# Patient Record
Sex: Male | Born: 1995 | Race: Black or African American | Hispanic: No | Marital: Single | Smoking: Current every day smoker
Health system: Southern US, Community
[De-identification: ages and names within clinical notes are randomized; demographics above are authoritative.]

---
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
## 2018-05-13 ENCOUNTER — Emergency Department
Admission: EM | Admit: 2018-05-13 | Discharge: 2018-05-13 | Disposition: A | Discharge status: Home / Self Care | Payer: No Typology Code available for payment source | Attending: Emergency Medicine | Admitting: Emergency Medicine

## 2018-05-13 ENCOUNTER — Other Ambulatory Visit: Payer: Self-pay

## 2018-05-13 DIAGNOSIS — W269XXA Contact with unspecified sharp object(s), initial encounter: Secondary | ICD-10-CM | POA: Diagnosis not present

## 2018-05-13 DIAGNOSIS — Y990 Civilian activity done for income or pay: Secondary | ICD-10-CM | POA: Diagnosis not present

## 2018-05-13 DIAGNOSIS — Y929 Unspecified place or not applicable: Secondary | ICD-10-CM | POA: Diagnosis not present

## 2018-05-13 DIAGNOSIS — F17210 Nicotine dependence, cigarettes, uncomplicated: Secondary | ICD-10-CM | POA: Insufficient documentation

## 2018-05-13 DIAGNOSIS — Y93E5 Activity, floor mopping and cleaning: Secondary | ICD-10-CM | POA: Diagnosis not present

## 2018-05-13 DIAGNOSIS — Z23 Encounter for immunization: Secondary | ICD-10-CM | POA: Diagnosis not present

## 2018-05-13 DIAGNOSIS — S61412A Laceration without foreign body of left hand, initial encounter: Secondary | ICD-10-CM | POA: Diagnosis not present

## 2018-05-13 MED ORDER — CEPHALEXIN 500 MG PO CAPS
500.0000 mg | ORAL_CAPSULE | Freq: Once | ORAL | Status: AC
  Administered 2018-05-13: 500 mg via ORAL
  Filled 2018-05-13: qty 1

## 2018-05-13 MED ORDER — CEPHALEXIN 500 MG PO CAPS: 500.0000 mg | ORAL_CAPSULE | Freq: Four times a day (QID) | ORAL | 0 refills | Status: DC

## 2018-05-13 MED ORDER — TETANUS-DIPHTH-ACELL PERTUSSIS 5-2.5-18.5 LF-MCG/0.5 IM SUSP
0.5000 mL | Freq: Once | INTRAMUSCULAR | Status: AC
  Administered 2018-05-13: 0.5 mL via INTRAMUSCULAR
  Filled 2018-05-13: qty 0.5

## 2018-05-13 MED ORDER — CEPHALEXIN 500 MG PO CAPS: 500.0000 mg | ORAL_CAPSULE | Freq: Four times a day (QID) | ORAL | 0 refills | Status: AC
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
## 2018-05-13 NOTE — ED Provider Notes (Signed)
[facility] Emergency Department Provider Note  ____________________________________________   First MD Initiated Contact with Patient 05/13/18 0215     (approximate)  I have reviewed the triage vital signs and the nursing notes.   HISTORY  Chief Complaint Laceration    HPI Grant Tucker is a 22 y.o. male here for evaluation of injury to his left hand.  Patient reports while at work, he was reaching into a dishwasher machine to clean out the filter when he probably cut his hand against a piece of glass.  Reports he has a cut across the palm of the left hand.  No other injury.  No numbness weakness or tingling.  Takes no medications no allergies.  Reports his last tetanus was several years ago.   No numbness weakness or tingling in the left hand.  History reviewed. No pertinent past medical history.  There are no active problems to display for this patient.   History reviewed. No pertinent surgical history.  No medication.  Allergies Patient has no known allergies.  No family history on file.  Social History Social History   Tobacco Use  . Smoking status: Current Every Day Smoker  . Smokeless tobacco: Never Used  Substance Use Topics  . Alcohol use: Never    Frequency: Never  . Drug use: Not on file    Review of Systems Constitutional: No fever/chills or other injury Eyes: No visual changes. ENT: No sore throat. Cardiovascular: Denies chest pain. Respiratory: Denies shortness of breath. Gastrointestinal: No abdominal pain.   Genitourinary: Negative for dysuria. Musculoskeletal: Negative for back pain. Skin: Negative for rash. Neurological: Negative for headaches, areas of focal weakness or numbness.    ____________________________________________   PHYSICAL EXAM:  VITAL SIGNS: ED Triage Vitals  Enc Vitals Group     BP 05/13/18 0058 129/78     Pulse Rate 05/13/18 0058 92     Resp 05/13/18 0058 16     Temp --    Temp Source 05/13/18 0058 Oral     SpO2 05/13/18 0058 96 %     Weight 05/13/18 0052 233 lb (105.7 kg)     Height 05/13/18 0052 5\' 11"  (1.803 m)     Head Circumference --      Peak Flow --      Pain Score 05/13/18 0052 2     Pain Loc --      Pain Edu? --      Excl. in [place]? --     Constitutional: Alert and oriented. Well appearing and in no acute distress. Eyes: Conjunctivae are normal. Head: Atraumatic. Mouth/Throat: Mucous membranes are moist. Neck: No stridor.  Cardiovascular: Normal rate, regular rhythm. Grossly normal heart sounds.  Good peripheral circulation.  Strong left radial pulse with strong capillary refill. Respiratory: Normal respiratory effort.  No retractions. Musculoskeletal:   RIGHT Right upper extremity demonstrates normal strength, good use of all muscles. No edema bruising or contusions of the right shoulder/upper arm, right elbow, right forearm / hand. Full range of motion of the right right upper extremity without pain. No evidence of trauma. Strong radial pulse. Intact median/ulnar/radial neuro-muscular exam.  LEFT Left upper extremity demonstrates normal strength, good use of all muscles. No edema bruising or contusions of the left shoulder/upper arm, left elbow, left forearm / hand. Full range of motion of the left  upper extremity without pain except for some discomfort across his palm. No evidence of trauma except for a superficial laceration spanning most the width  of his left palmar crease, is very superficial in nature with some very slight subcutaneous involvement only involving about the medial 1 cm bleeding is controlled.  There is no limitation in range of motion, no evidence of joint tendon or ligamentous injury.  Strong radial pulse. Intact median/ulnar/radial neuro-muscular exam.   Neurologic:  Normal speech and language. No gross focal neurologic deficits are appreciated.  Skin:  Skin is warm, dry and intact. No rash noted. Psychiatric: Mood and  affect are normal. Speech and behavior are normal.  ____________________________________________   LABS (all labs ordered are listed, but only abnormal results are displayed)  Labs Reviewed - No data to display ____________________________________________  EKG   ____________________________________________  RADIOLOGY   ____________________________________________   PROCEDURES  Procedure(s) performed: laceration     .Marland KitchenLaceration Repair Date/Time: 05/13/2018 3:43 AM Performed by: Sharyn Creamer, MD Authorized by: Sharyn Creamer, MD   Consent:    Consent obtained:  Verbal   Consent given by:  Patient Anesthesia (see MAR for exact dosages):    Anesthesia method:  None Laceration details:    Location:  Hand   Hand location:  L palm   Length (cm):  8   Depth (mm):  2 Repair type:    Repair type:  Simple Exploration:    Wound exploration: wound explored through full range of motion     Wound extent: no areolar tissue violation noted, no fascia violation noted, no foreign bodies/material noted, no muscle damage noted, no nerve damage noted, no tendon damage noted and no vascular damage noted     Contaminated: no   Treatment:    Area cleansed with:  Betadine   Amount of cleaning:  Standard   Irrigation solution:  Sterile saline and tap water   Visualized foreign bodies/material removed: no   Skin repair:    Repair method:  Tissue adhesive Approximation:    Approximation:  Close Post-procedure details:    Dressing:  Open (no dressing)   Patient tolerance of procedure:  Tolerated well, no immediate complications    Critical Care performed: No  ____________________________________________   INITIAL IMPRESSION / ASSESSMENT AND PLAN / ED COURSE  Pertinent labs & imaging results that were available during my care of the patient were reviewed by me and considered in my medical decision making (see chart for details).   Laceration to left palmar surface.  It is  superficial in nature with bleeding well controlled no evidence of deep injury to noted.  No evidence of neurovascular compromise.  No other injuries.  Successfully repaired with Dermabond.  Will prescribe cephalexin, tetanus updated.  Carefully counseled on treatment, wound care, and careful return precautions.  Return precautions and treatment recommendations and follow-up discussed with the patient who is agreeable with the plan.       ____________________________________________   FINAL CLINICAL IMPRESSION(S) / ED DIAGNOSES  Final diagnoses:  Laceration of left hand without foreign body, initial encounter        Note:  This document was prepared using Dragon voice recognition software and may include unintentional dictation errors       Sharyn Creamer, MD 05/13/18 [phone]

## 2018-05-13 NOTE — ED Triage Notes (Signed)
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 Bleeding controlled at this time. Brings own forms.

## 2019-01-09 ENCOUNTER — Emergency Department
Admission: EM | Admit: 2019-01-09 | Discharge: 2019-01-09 | Disposition: A | Discharge status: Home / Self Care | Payer: BC Managed Care – PPO | Attending: Student in an Organized Health Care Education/Training Program | Admitting: Student in an Organized Health Care Education/Training Program

## 2019-01-09 ENCOUNTER — Encounter: Payer: Self-pay | Admitting: Emergency Medicine

## 2019-01-09 ENCOUNTER — Emergency Department: Payer: BC Managed Care – PPO

## 2019-01-09 ENCOUNTER — Other Ambulatory Visit: Payer: Self-pay

## 2019-01-09 DIAGNOSIS — M7918 Myalgia, other site: Secondary | ICD-10-CM | POA: Diagnosis not present

## 2019-01-09 DIAGNOSIS — S0990XA Unspecified injury of head, initial encounter: Secondary | ICD-10-CM | POA: Diagnosis not present

## 2019-01-09 DIAGNOSIS — Y9389 Activity, other specified: Secondary | ICD-10-CM | POA: Insufficient documentation

## 2019-01-09 DIAGNOSIS — Y92414 Local residential or business street as the place of occurrence of the external cause: Secondary | ICD-10-CM | POA: Diagnosis not present

## 2019-01-09 DIAGNOSIS — S12300A Unspecified displaced fracture of fourth cervical vertebra, initial encounter for closed fracture: Secondary | ICD-10-CM | POA: Insufficient documentation

## 2019-01-09 DIAGNOSIS — F17200 Nicotine dependence, unspecified, uncomplicated: Secondary | ICD-10-CM | POA: Diagnosis not present

## 2019-01-09 DIAGNOSIS — S199XXA Unspecified injury of neck, initial encounter: Secondary | ICD-10-CM | POA: Diagnosis present

## 2019-01-09 DIAGNOSIS — Y999 Unspecified external cause status: Secondary | ICD-10-CM | POA: Diagnosis not present

## 2019-01-09 LAB — URINALYSIS, COMPLETE (UACMP) WITH MICROSCOPIC
Bacteria, UA: NONE SEEN
Bilirubin Urine: NEGATIVE
Glucose, UA: NEGATIVE mg/dL
Hgb urine dipstick: NEGATIVE
Ketones, ur: NEGATIVE mg/dL
Leukocytes,Ua: NEGATIVE
Nitrite: NEGATIVE
Protein, ur: NEGATIVE mg/dL
Specific Gravity, Urine: 1.03 (ref 1.005–1.030)
Squamous Epithelial / HPF: NONE SEEN (ref 0–5)
pH: 6 (ref 5.0–8.0)

## 2019-01-09 MED ORDER — MELOXICAM 15 MG PO TABS: 15.0000 mg | ORAL_TABLET | Freq: Every day | ORAL | 0 refills | Status: DC

## 2019-01-09 MED ORDER — BACLOFEN 10 MG PO TABS: 10.0000 mg | ORAL_TABLET | Freq: Every day | ORAL | 1 refills | Status: AC

## 2019-01-09 MED ORDER — HYDROCODONE-ACETAMINOPHEN 5-325 MG PO TABS: 1.0000 | ORAL_TABLET | Freq: Four times a day (QID) | ORAL | 0 refills | Status: DC | PRN
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
## 2019-01-09 NOTE — ED Notes (Signed)
Phila collar placed   Awaiting MD eval
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
## 2019-01-09 NOTE — Discharge Instructions (Addendum)
Follow-up with Dr. Lacinda Axon within the week.  Call for an appointment.  Take the medication if needed for muscle spasms and pain.  The baclofen is the muscle relaxer.  Meloxicam is an anti-inflammatory.  Vicodin is a pain medication that will make you drowsy and could cause constipation.  This is also an addictive type medication so uses sparingly.  Return if any abdominal pain, chest pain, or shortness of breath.  Return if increasing pain in your neck or back.  It is normal to feel more sore tomorrow and the next day.

## 2021-05-11 ENCOUNTER — Other Ambulatory Visit: Payer: Self-pay

## 2021-05-11 ENCOUNTER — Emergency Department
Admission: EM | Admit: 2021-05-11 | Discharge: 2021-05-11 | Disposition: A | Discharge status: Home / Self Care | Payer: BC Managed Care – PPO | Attending: Emergency Medicine | Admitting: Emergency Medicine

## 2021-05-11 ENCOUNTER — Emergency Department: Payer: BC Managed Care – PPO

## 2021-05-11 DIAGNOSIS — S0093XA Contusion of unspecified part of head, initial encounter: Secondary | ICD-10-CM | POA: Insufficient documentation

## 2021-05-11 DIAGNOSIS — Z23 Encounter for immunization: Secondary | ICD-10-CM | POA: Diagnosis not present

## 2021-05-11 DIAGNOSIS — S0990XA Unspecified injury of head, initial encounter: Secondary | ICD-10-CM | POA: Diagnosis present

## 2021-05-11 DIAGNOSIS — F17210 Nicotine dependence, cigarettes, uncomplicated: Secondary | ICD-10-CM | POA: Diagnosis not present

## 2021-05-11 DIAGNOSIS — M542 Cervicalgia: Secondary | ICD-10-CM

## 2021-05-11 DIAGNOSIS — S50312A Abrasion of left elbow, initial encounter: Secondary | ICD-10-CM | POA: Diagnosis not present

## 2021-05-11 MED ORDER — TETANUS-DIPHTH-ACELL PERTUSSIS 5-2.5-18.5 LF-MCG/0.5 IM SUSY
0.5000 mL | PREFILLED_SYRINGE | Freq: Once | INTRAMUSCULAR | Status: AC
  Administered 2021-05-11: 0.5 mL via INTRAMUSCULAR
  Filled 2021-05-11: qty 0.5

## 2021-05-11 MED ORDER — ACETAMINOPHEN 500 MG PO TABS
1000.0000 mg | ORAL_TABLET | Freq: Once | ORAL | Status: AC
  Administered 2021-05-11: 1000 mg via ORAL
  Filled 2021-05-11: qty 2
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
## 2022-08-09 IMAGING — CT CT HEAD W/O CM
4 series · 17 of 47 positions shown, 19 images · non-contrast
Comparison: CT head 01/09/2019

CLINICAL DATA: Facial trauma

EXAM:
CT HEAD WITHOUT CONTRAST
CT MAXILLOFACIAL WITHOUT CONTRAST
TECHNIQUE: Multidetector CT imaging of the head and maxillofacial structures
were performed using the standard protocol without intravenous
contrast. Multiplanar CT image reconstructions of the maxillofacial
structures were also generated.

[Series 2: head bone · axial · 0.48mm/px · z∈[-118,-62]mm · 4 of 80 slices shown]
[im 8/80  bone]
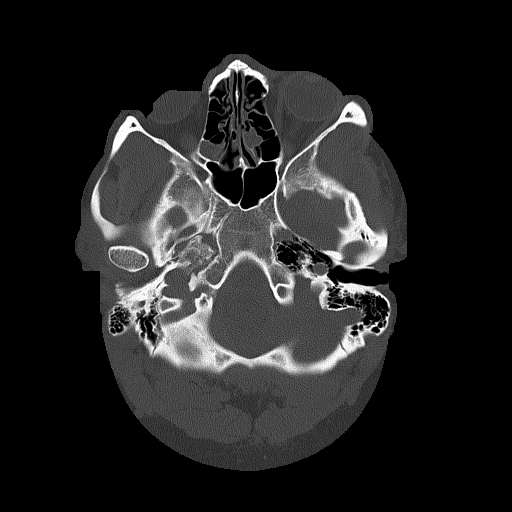
[im 16/80  bone]
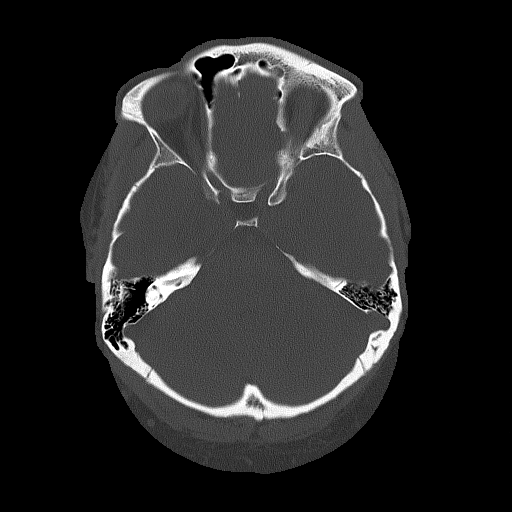
[im 24/80  bone]
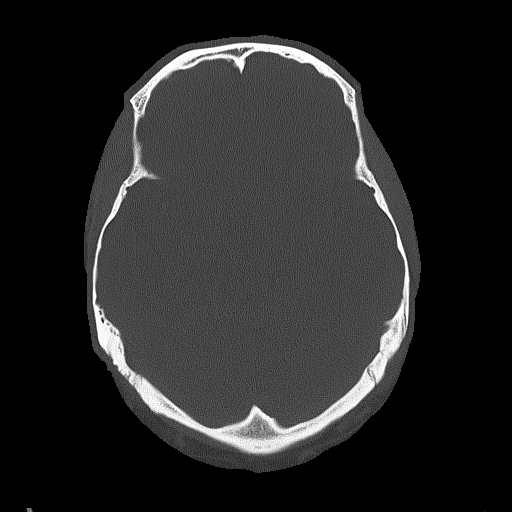
[im 36/80  bone]
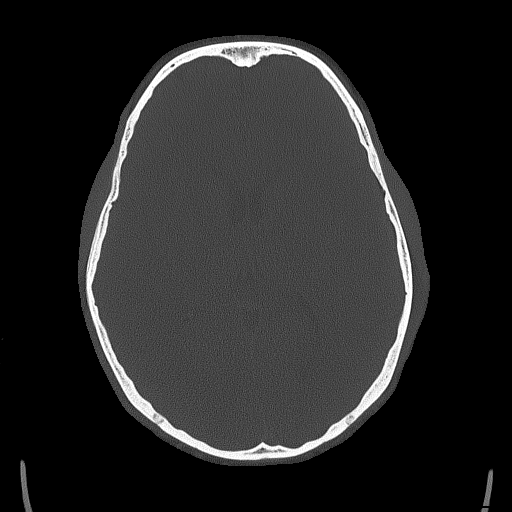

[Series 3: head wo · axial · 0.48mm/px · z∈[-117,+3]mm · 7 of 32 slices shown, 9 images]
[im 4/32  brain]
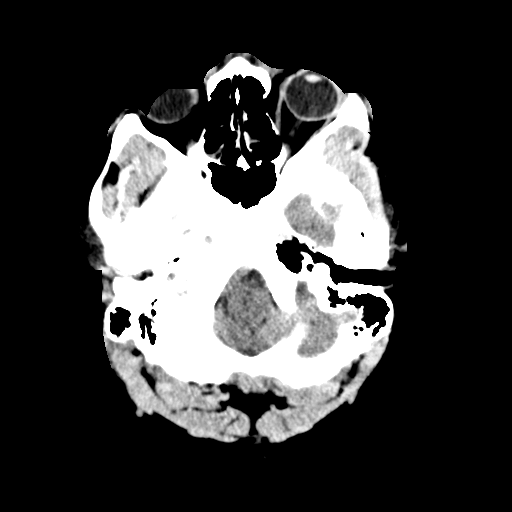
[im 4/32  bone]
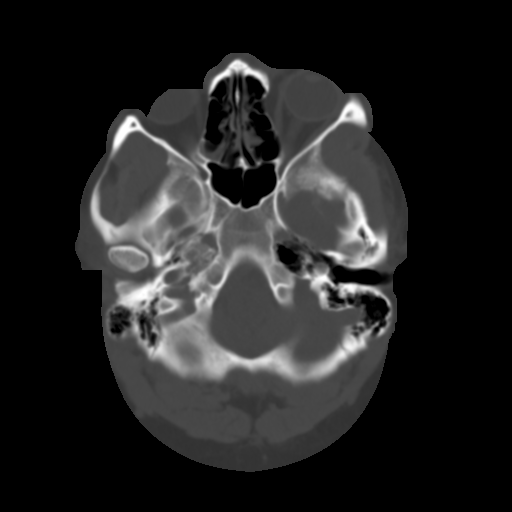
[im 8/32  brain]
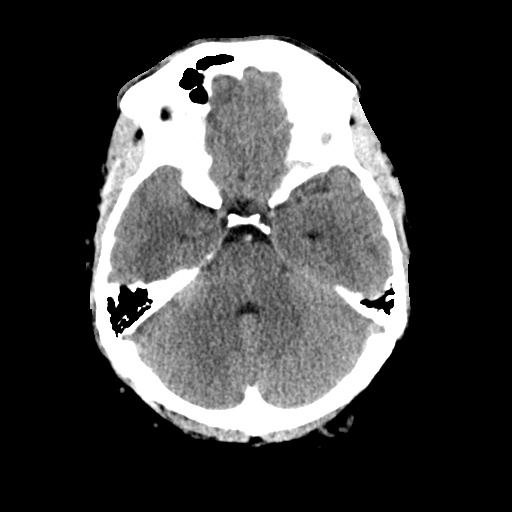
[im 12/32  brain]
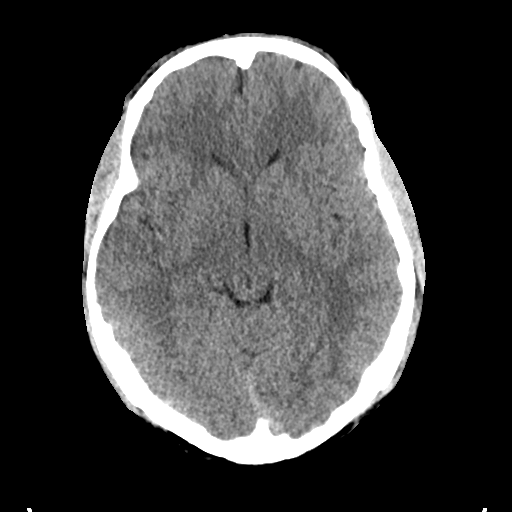
[im 16/32  brain]
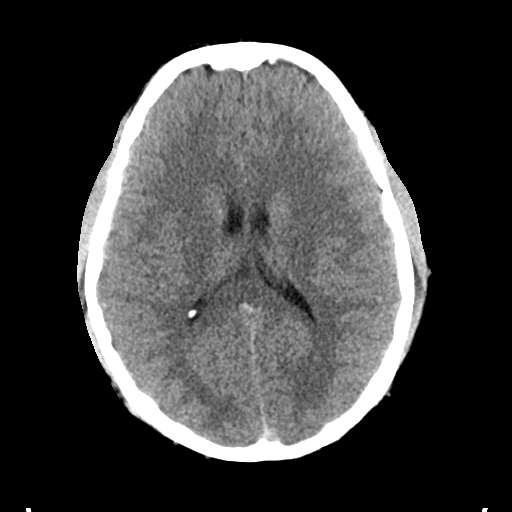
[im 20/32  brain]
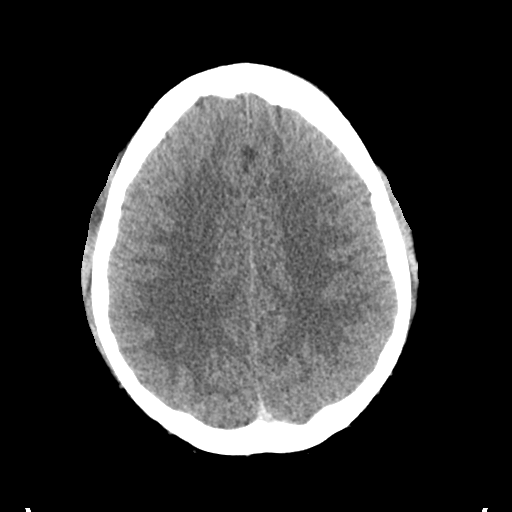
[im 20/32  bone]
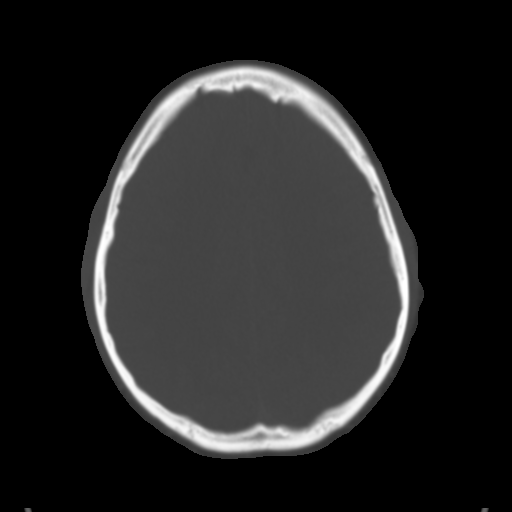
[im 24/32  brain]
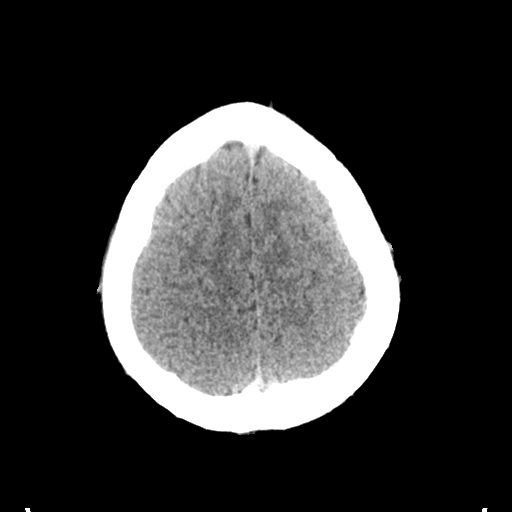
[im 28/32  brain]
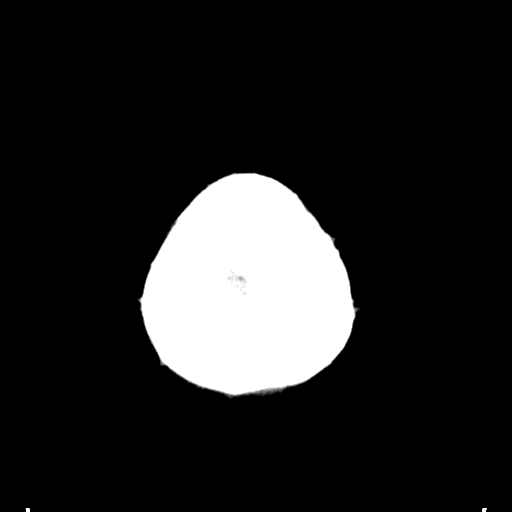

[Series 4: coronal soft tissue · coronal · 0.36mm/px · 3 of 76 slices shown]
[im 26/76  brain]
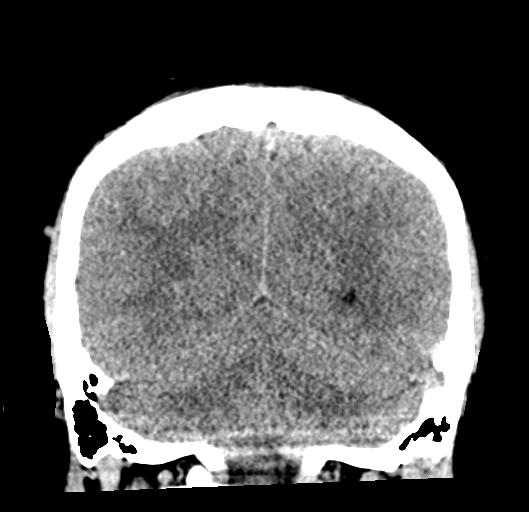
[im 34/76  brain]
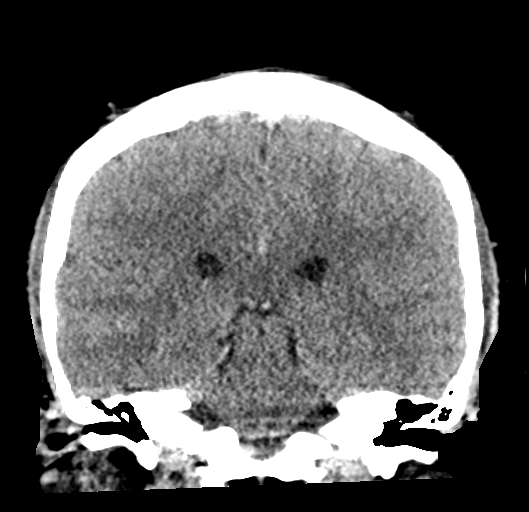
[im 42/76  brain]
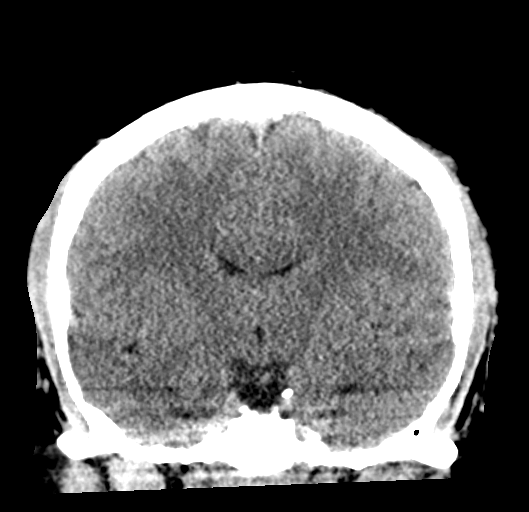

[Series 5: sagittal soft tissue · sagittal · 0.36mm/px · 3 of 64 slices shown]
[im 22/64  brain]
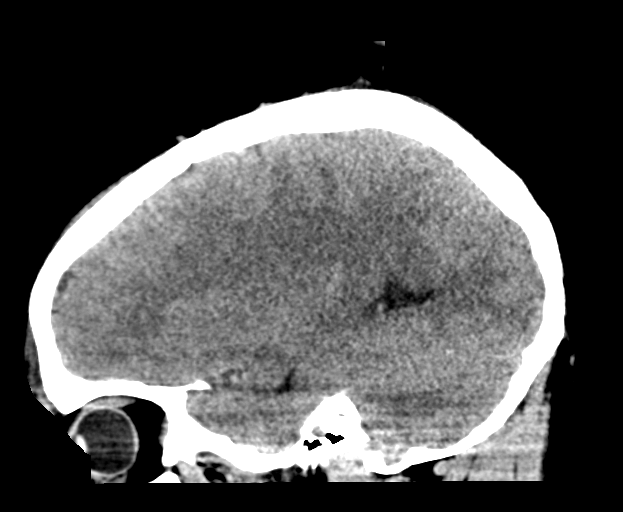
[im 32/64  brain]
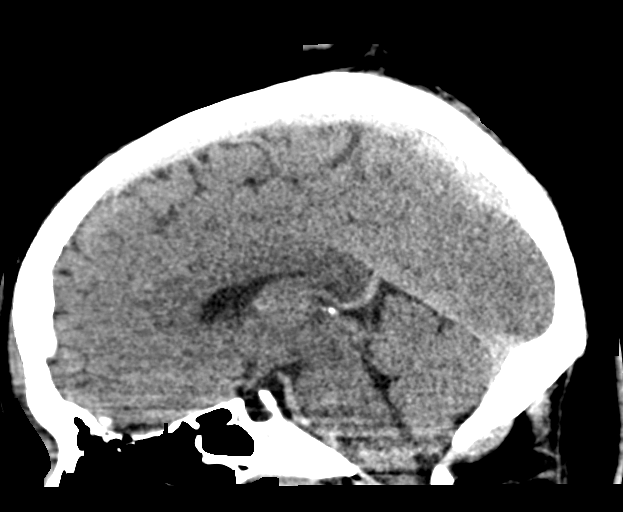
[im 43/64  brain]
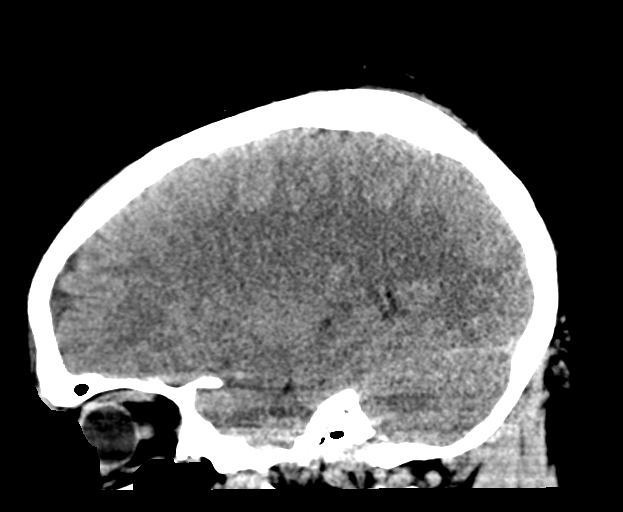

[17 of 47 positions shown; findings below may reference images not displayed]

FINDINGS: CT HEAD FINDINGS

Brain: There is no acute intracranial hemorrhage, mass effect, or
edema. Gray-white differentiation is preserved. No extra-axial
collection. Ventricles and sulci are normal in size and
configuration.

Vascular: No hyperdense vessel or unexpected calcification.

Skull: Unremarkable.

Other: Mastoid air cells are clear.

CT MAXILLOFACIAL FINDINGS

Osseous: No definite acute facial fracture. There is mild nasal bone
irregularity.

Orbits: No intraorbital hematoma.

Sinuses: Mucosal thickening is present.

Soft tissues: Left periorbital and malar soft tissue swelling.
IMPRESSION: No evidence of acute intracranial injury.

No definite acute facial fracture. There is mild nasal bone
irregularity. Correlate with exam/symptoms.

## 2023-05-06 ENCOUNTER — Ambulatory Visit: Payer: BC Managed Care – PPO

## 2023-09-11 ENCOUNTER — Other Ambulatory Visit: Payer: Self-pay

## 2023-09-11 ENCOUNTER — Emergency Department
Admission: EM | Admit: 2023-09-11 | Discharge: 2023-09-11 | Disposition: A | Discharge status: Home / Self Care | Payer: Medicaid Other | Attending: Emergency Medicine | Admitting: Emergency Medicine

## 2023-09-11 ENCOUNTER — Encounter: Payer: Self-pay | Admitting: Emergency Medicine

## 2023-09-11 ENCOUNTER — Emergency Department: Payer: Medicaid Other

## 2023-09-11 DIAGNOSIS — S60221A Contusion of right hand, initial encounter: Secondary | ICD-10-CM

## 2023-09-11 DIAGNOSIS — S61411A Laceration without foreign body of right hand, initial encounter: Secondary | ICD-10-CM | POA: Insufficient documentation

## 2023-09-11 DIAGNOSIS — X58XXXA Exposure to other specified factors, initial encounter: Secondary | ICD-10-CM | POA: Diagnosis not present

## 2023-09-11 DIAGNOSIS — S6991XA Unspecified injury of right wrist, hand and finger(s), initial encounter: Secondary | ICD-10-CM | POA: Diagnosis present

## 2023-09-11 MED ORDER — AMOXICILLIN-POT CLAVULANATE 875-125 MG PO TABS: 1.0000 | ORAL_TABLET | Freq: Two times a day (BID) | ORAL | 0 refills | Status: AC

## 2023-09-11 MED ORDER — AMOXICILLIN-POT CLAVULANATE 875-125 MG PO TABS
1.0000 | ORAL_TABLET | Freq: Once | ORAL | Status: AC
  Administered 2023-09-11: 1 via ORAL
  Filled 2023-09-11: qty 1
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
## 2023-09-11 NOTE — Discharge Instructions (Signed)
 You may remove the dressing after 24 hours, please keep the area clean you may wash with a mild soap and water.  Please keep covered with Neosporin and gauze or bandage.  Return to the emergency department for any significant swelling increased pain any development of discharge/pus or fever.
